# Patient Record
Sex: Female | Born: 1965 | Hispanic: No
Health system: Southern US, Community
[De-identification: ages and names within clinical notes are randomized; demographics above are authoritative.]

## PROBLEM LIST (undated history)

## (undated) DIAGNOSIS — K219 Gastro-esophageal reflux disease without esophagitis: Secondary | ICD-10-CM

---

## 2005-05-19 ENCOUNTER — Ambulatory Visit: Payer: Self-pay | Admitting: Family Medicine

## 2008-02-19 ENCOUNTER — Emergency Department: Payer: Self-pay | Admitting: Emergency Medicine

## 2008-06-29 ENCOUNTER — Emergency Department: Payer: Self-pay | Admitting: Emergency Medicine

## 2013-10-01 ENCOUNTER — Ambulatory Visit: Payer: Self-pay | Admitting: Family Medicine

## 2014-02-16 ENCOUNTER — Emergency Department: Payer: Self-pay | Admitting: Emergency Medicine

## 2018-05-24 ENCOUNTER — Emergency Department
Admission: EM | Admit: 2018-05-24 | Discharge: 2018-05-25 | Disposition: A | Discharge status: Home / Self Care | Payer: Self-pay | Attending: Emergency Medicine | Admitting: Emergency Medicine

## 2018-05-24 ENCOUNTER — Encounter: Payer: Self-pay | Admitting: Emergency Medicine

## 2018-05-24 DIAGNOSIS — J029 Acute pharyngitis, unspecified: Secondary | ICD-10-CM

## 2018-05-24 DIAGNOSIS — F17200 Nicotine dependence, unspecified, uncomplicated: Secondary | ICD-10-CM | POA: Insufficient documentation

## 2018-05-24 HISTORY — DX: Gastro-esophageal reflux disease without esophagitis: K21.9

## 2018-05-24 NOTE — ED Triage Notes (Signed)
Patient c/o allergic reaction with throat tightness. Patient reports facial swelling. Patient reports reaction began at 2130, patient took benadryl with no relief - still feels like 2 benadryl is stuck in her throat.

## 2018-05-25 ENCOUNTER — Emergency Department: Payer: Self-pay

## 2018-05-25 MED ORDER — LIDOCAINE VISCOUS HCL 2 % MT SOLN
15.0000 mL | Freq: Once | OROMUCOSAL | Status: AC
  Administered 2018-05-25: 15 mL via ORAL
  Filled 2018-05-25: qty 15

## 2018-05-25 MED ORDER — DEXAMETHASONE 10 MG/ML FOR PEDIATRIC ORAL USE
10.0000 mg | Freq: Once | INTRAMUSCULAR | Status: AC
  Administered 2018-05-25: 10 mg via ORAL
  Filled 2018-05-25: qty 1

## 2018-05-25 MED ORDER — ALUM & MAG HYDROXIDE-SIMETH 200-200-20 MG/5ML PO SUSP
30.0000 mL | Freq: Once | ORAL | Status: AC
  Administered 2018-05-25: 30 mL via ORAL
  Filled 2018-05-25: qty 30

## 2018-05-25 NOTE — Discharge Instructions (Addendum)
Your work-up was reassuring today.  Rather than an allergic reaction, I think it may be possible that you have a viral infection that is causing your sore throat and the sensation that you have something stuck in it.  Please continue to eat and drink plenty of fluids and take over-the-counter ibuprofen and/or Tylenol as needed for discomfort.  Follow-up with your regular doctor at the next available opportunity and return to the emergency department if you develop new or worsening symptoms that concern you.

## 2018-05-25 NOTE — ED Notes (Signed)
Reports throat feeling better, no longer as hoarse when inially arrived. Vss. Awaiting disposition.

## 2018-05-25 NOTE — ED Provider Notes (Signed)
[facility] Emergency Department Provider Note  ____________________________________________   First MD Initiated Contact with Patient 05/25/18 0015     (approximate)  I have reviewed the triage vital signs and the nursing notes.   HISTORY  Chief Complaint Allergic Reaction    HPI Ebony Cohen is a 53 y.o. female who presents for evaluation of possible allergic reaction and some discomfort in her throat.  She reports that she started feeling a scratchy throat and some possible swelling in her throat earlier tonight.  She had a severe reaction a couple of years ago to a medication that she no longer takes (she says it was not lisinopril but she does not remember the name of it) that resulted in swelling in her throat, tongue, and lips, so she was concerned it may be something similar.  She took a Benadryl but was not feeling better after couple of hours or took another 1 which feels like it may be stuck in her throat.  She is breathing comfortably, has not had a cough, is speaking clearly although with a slightly hoarse voice, and is not having any trouble swallowing her own secretions.  In fact she says she has been trying to drink plenty of fluids but continues to have the sensation of something stuck in her throat.  She describes the symptoms as mild to moderate but were concerning to her.  Nothing in particular makes it better or worse.  She denies any recent fever/chills, chest pain or short nests of breath, but she does state that she just recently was getting over a viral respiratory infection and in fact has gone through 2 different rounds of outpatient antibiotics including Augmentin and azithromycin.  She feels better in general but now has the throat symptoms.  Past Medical History:  Diagnosis Date  . GERD (gastroesophageal reflux disease)     There are no active problems to display for this patient.   History reviewed. No pertinent surgical  history.  Prior to Admission medications   Not on File    Allergies Blue dyes (parenteral)  No family history on file.  Social History Social History   Tobacco Use  . Smoking status: Current Every Day Smoker  . Smokeless tobacco: Never Used  Substance Use Topics  . Alcohol use: Not Currently  . Drug use: Not on file    Review of Systems Constitutional: No fever/chills Eyes: No visual changes. ENT: Sore throat with possible swelling and globus sensation. Cardiovascular: Denies chest pain. Respiratory: Denies shortness of breath.  Recently treated for viral versus bacterial respiratory infection. Gastrointestinal: No abdominal pain.  No nausea, no vomiting.  No diarrhea.  No constipation. Genitourinary: Negative for dysuria. Musculoskeletal: Negative for neck pain.  Negative for back pain. Integumentary: Negative for rash. Neurological: Negative for headaches, focal weakness or numbness.   ____________________________________________   PHYSICAL EXAM:  VITAL SIGNS: ED Triage Vitals  Enc Vitals Group     BP 05/24/18 2356 123/82     Pulse Rate 05/24/18 2356 (!) 104     Resp 05/24/18 2356 (!) 22     Temp 05/24/18 2356 97.6 F (36.4 C)     Temp Source 05/24/18 2356 Oral     SpO2 05/24/18 2356 98 %     Weight 05/24/18 2357 115.2 kg (254 lb)     Height 05/24/18 2357 1.588 m (5' 2.5")     Head Circumference --      Peak Flow --  Pain Score 05/24/18 2356 6     Pain Loc --      Pain Edu? --      Excl. in GC? --     Constitutional: Alert and oriented. Well appearing and in no acute distress. Eyes: Conjunctivae are normal.  Head: Atraumatic. Nose: No congestion/rhinnorhea. Mouth/Throat: Mucous membranes are moist.  Oropharynx non-erythematous.  No swelling of the tongue or lips, no angioedema in general. Neck: No stridor.  No meningeal signs.  No swelling. Cardiovascular: Normal rate, regular rhythm. Good peripheral circulation. Grossly normal heart  sounds. Respiratory: Normal respiratory effort.  No retractions. Lungs CTAB. Gastrointestinal: Soft and nontender. No distention.  Musculoskeletal: No lower extremity tenderness nor edema. No gross deformities of extremities. Neurologic:  Normal speech and language. No gross focal neurologic deficits are appreciated.  Skin:  Skin is warm, dry and intact. No rash noted. Psychiatric: Mood and affect are normal. Speech and behavior are normal.  ____________________________________________   LABS (all labs ordered are listed, but only abnormal results are displayed)  Labs Reviewed - No data to display ____________________________________________  EKG  No indication for EKG ____________________________________________  RADIOLOGY I, Loleta Rose, personally viewed and evaluated these images (plain radiographs) as part of my medical decision making, as well as reviewing the written report by the radiologist.  ED MD interpretation: No soft tissue abnormality identified on soft tissue neck radiographs  Official radiology report(s): Dg Neck Soft Tissue  Result Date: 05/25/2018 CLINICAL DATA:  Sore throat and globus sensation EXAM: NECK SOFT TISSUES - 1+ VIEW COMPARISON:  None. FINDINGS: There is no evidence of retropharyngeal soft tissue swelling or epiglottic enlargement. The cervical airway is unremarkable and no radio-opaque foreign body identified. IMPRESSION: No soft tissue abnormality of the neck. Electronically Signed   By: Deatra Robinson M.D.   On: 05/25/2018 00:47    ____________________________________________   PROCEDURES  Critical Care performed: No   Procedure(s) performed:   Procedures   ____________________________________________   INITIAL IMPRESSION / ASSESSMENT AND PLAN / ED COURSE  As part of my medical decision making, I reviewed the following data within the electronic MEDICAL RECORD NUMBER Nursing notes reviewed and incorporated, Old chart reviewed, Radiograph  reviewed , Notes from prior ED visits     Differential diagnosis includes, but is not limited to, viral illness, angioedema, globus sensation, retained esophageal foreign body, allergic reaction/anaphylaxis.  The patient has no rash, no itching, no difficulty breathing, and no obvious angioedema.  I do not think that her symptoms tonight represent an allergic reaction/anaphylaxis.  She has been dealing with a respiratory infection for an extended period of time and I believe that she has a mild viral pharyngitis.  She continues to have a globus sensation which could have been the result of having a Benadryl get stuck but she is having no difficulty taking oral fluids or handling her own secretions.  Radiographs were reassuring with no soft tissue swelling or suggestion of epiglottitis or retropharyngeal infection.  She has been stable for hours in the emergency department and I think it is appropriate for discharge and outpatient follow-up.  I gave my usual customary return precautions and she understands and agrees with the plan.     ____________________________________________  FINAL CLINICAL IMPRESSION(S) / ED DIAGNOSES  Final diagnoses:  Sore throat     MEDICATIONS GIVEN DURING THIS VISIT:  Medications  alum & mag hydroxide-simeth (MAALOX/MYLANTA) 200-200-20 MG/5ML suspension 30 mL (30 mLs Oral Given 05/25/18 0040)    And  lidocaine (XYLOCAINE)  2 % viscous mouth solution 15 mL (15 mLs Oral Given 05/25/18 0040)  dexamethasone (DECADRON) 10 MG/ML injection for Pediatric ORAL use 10 mg (10 mg Oral Given 05/25/18 0329)     ED Discharge Orders    None       Note:  This document was prepared using Dragon voice recognition software and may include unintentional dictation errors.    Loleta RoseForbach, Trevan Messman, MD 05/25/18 [phone]

## 2018-05-25 NOTE — ED Notes (Signed)
Pt verbalized understanding of d/c instructions and f/u care. No further questions at this time. Pt ambulatory with steady gait to the exit.  

## 2018-05-25 NOTE — ED Notes (Signed)
Returned from [employer]. GI cocktail given. Disposition pending.

## 2018-05-25 NOTE — ED Notes (Signed)
Reports took her meds and had a bowl of ceral prior to going to work and began feeling throat tightness. Patient states feeling hoarse, took 50mg  of benadryl po prior to ed arrival. Awaiting md eval and  Plan of care. Patient speaking full sentences with clear lungs sating 99% on ra.

## 2019-11-24 IMAGING — CR DG NECK SOFT TISSUE
2 series · 2 of 2 positions shown · non-contrast
Comparison: None.

CLINICAL DATA: Sore throat and globus sensation

EXAM:
NECK SOFT TISSUES - 1+ VIEW

[neck lat]
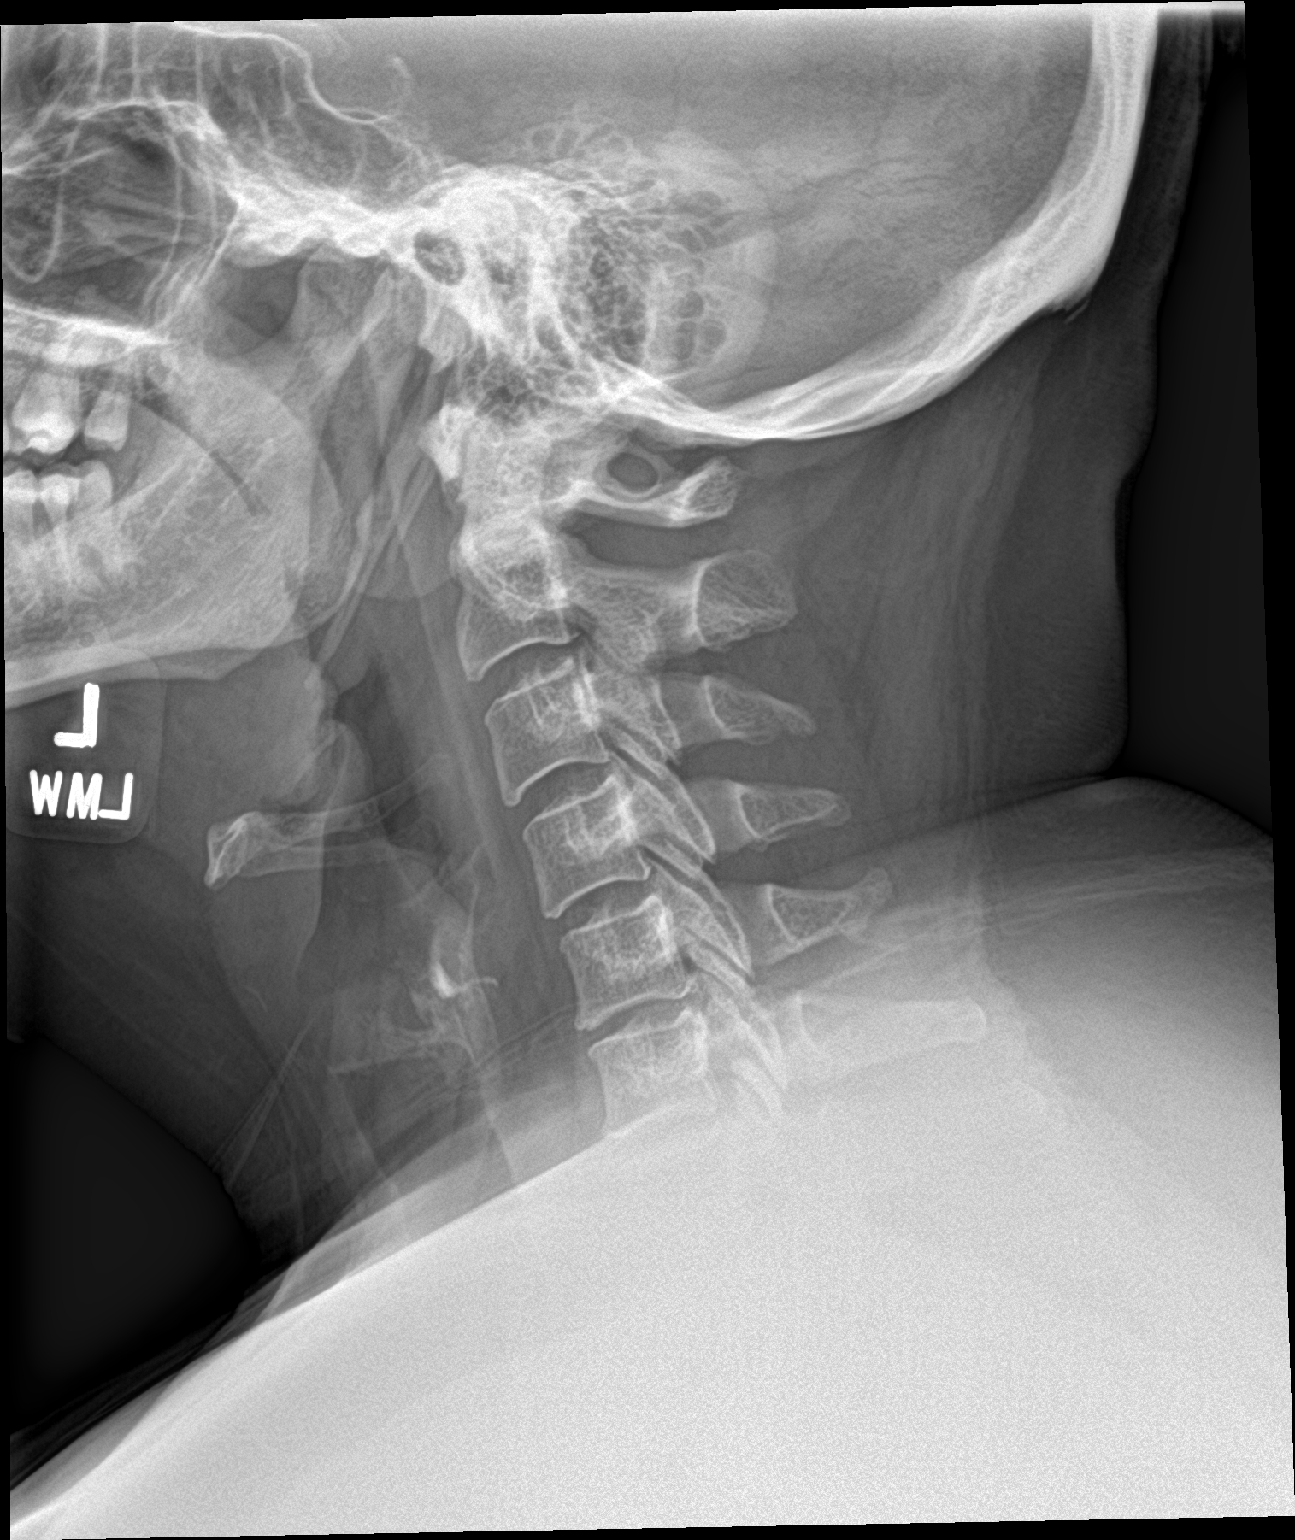

[neck ap]
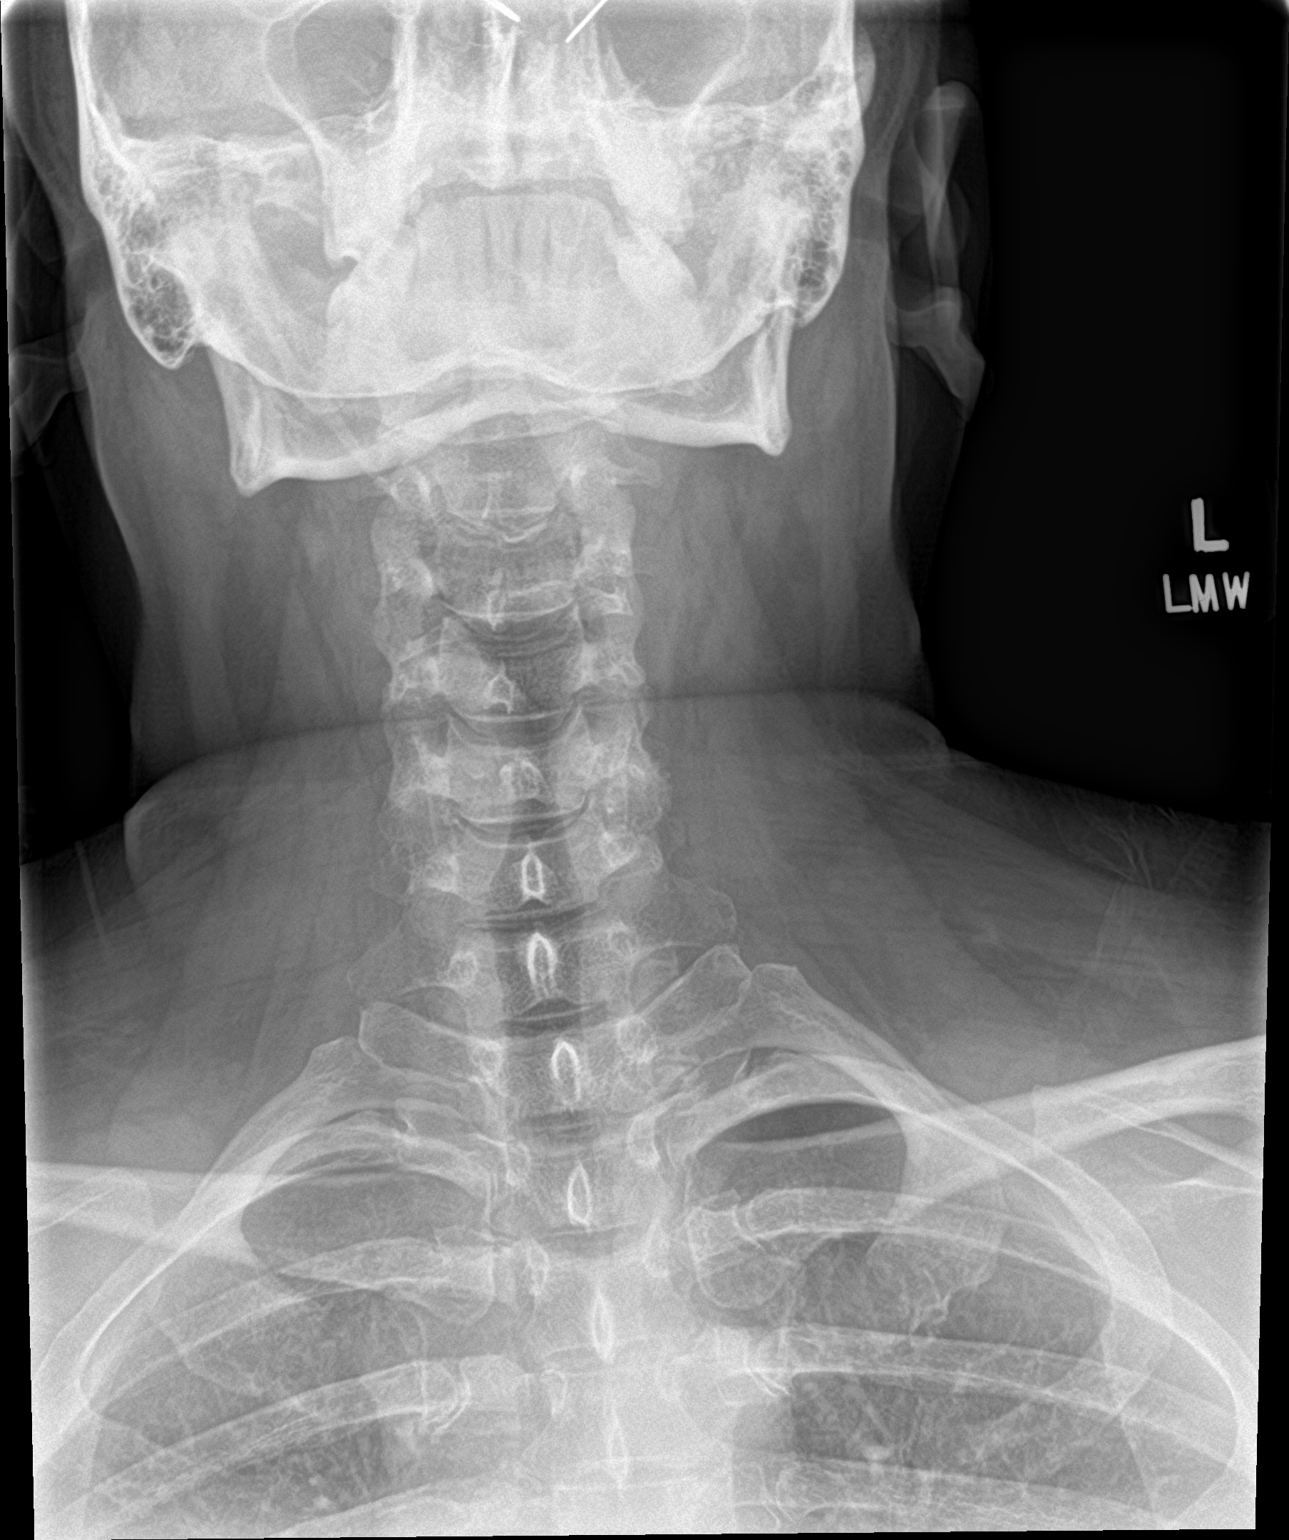

[2 of 2 positions shown; findings below may reference images not displayed]

FINDINGS: There is no evidence of retropharyngeal soft tissue swelling or
epiglottic enlargement. The cervical airway is unremarkable and no
radio-opaque foreign body identified.
IMPRESSION: No soft tissue abnormality of the neck.

## 2020-11-11 ENCOUNTER — Other Ambulatory Visit: Payer: Self-pay

## 2020-11-11 ENCOUNTER — Emergency Department
Admission: EM | Admit: 2020-11-11 | Discharge: 2020-11-11 | Disposition: A | Discharge status: Home / Self Care | Payer: BLUE CROSS/BLUE SHIELD | Attending: Emergency Medicine | Admitting: Emergency Medicine

## 2020-11-11 ENCOUNTER — Encounter: Payer: Self-pay | Admitting: Emergency Medicine

## 2020-11-11 DIAGNOSIS — F17210 Nicotine dependence, cigarettes, uncomplicated: Secondary | ICD-10-CM | POA: Diagnosis not present

## 2020-11-11 DIAGNOSIS — M25522 Pain in left elbow: Secondary | ICD-10-CM | POA: Diagnosis not present

## 2020-11-11 DIAGNOSIS — M25512 Pain in left shoulder: Secondary | ICD-10-CM | POA: Diagnosis not present

## 2020-11-11 DIAGNOSIS — M542 Cervicalgia: Secondary | ICD-10-CM | POA: Diagnosis present

## 2020-11-11 DIAGNOSIS — M5412 Radiculopathy, cervical region: Secondary | ICD-10-CM | POA: Diagnosis not present

## 2020-11-11 MED ORDER — IBUPROFEN 600 MG PO TABS
600.0000 mg | ORAL_TABLET | ORAL | Status: AC
  Administered 2020-11-11: 600 mg via ORAL
  Filled 2020-11-11: qty 1

## 2020-11-11 MED ORDER — OXYCODONE-ACETAMINOPHEN 5-325 MG PO TABS: 1.0000 | ORAL_TABLET | Freq: Four times a day (QID) | ORAL | 0 refills | Status: AC | PRN

## 2020-11-11 MED ORDER — KETOROLAC TROMETHAMINE 60 MG/2ML IM SOLN: 60.0000 mg | Freq: Once | INTRAMUSCULAR | Status: DC

## 2020-11-11 MED ORDER — METHYLPREDNISOLONE 4 MG PO TBPK: ORAL_TABLET | ORAL | 0 refills | Status: AC

## 2020-11-11 NOTE — ED Triage Notes (Signed)
Pt comes into the ED via POV c/o left shoulder pain and neck pain.  PT states she was seen by UC earlier in the week and given muscle relaxer's.  Pt states they only help for a short amount of time.  PT tearful in triage at this time and in NAD>

## 2020-11-11 NOTE — Discharge Instructions (Addendum)
Stop hydrocodone. Change to oxycodone (as prescribed today). Follow-up with Dr. Marcell Barlow or his partner in spine/neurosurgery clinic.

## 2020-11-11 NOTE — ED Notes (Addendum)
Pt denies any known injury, states that last Thursday am she woke up with excruciating pain in her left shoulder radiating down into her left arm, pt states that she went to the ortho urgent care and was diagnosed with a pinched nerve and placed on skelaxin, hydrocodone, and prednisone. Pt is crying stating that she woke up from a nap and her pain now is much worse

## 2020-11-11 NOTE — ED Provider Notes (Addendum)
[facility] Emergency Department Provider Note   ____________________________________________   Event Date/Time   First MD Initiated Contact with Patient 11/11/20 2037     (approximate)  I have reviewed the triage vital signs and the nursing notes.   HISTORY  Chief Complaint Neck Pain and Shoulder Pain    HPI Ebony Cohen is a 55 y.o. female's been experiencing left shoulder pain and left neck pain since about the end of June.  She reports the pain started and feels like a sharp discomfort at the base of her left neck radiating towards her left shoulder and sometimes shoots down towards the back of the left elbow.  It sharp and lancinating nature.  It is made worse by turning the head to the left as well as looking up and down  No chest pain.  No trouble breathing.  No fevers.  No nausea or vomiting  No numbness tingling or weakness except at times when she was showering when she is moving her arm around a lot she felt like it just feels a little bit fatigued in the left shoulder area  Denies any injury.  No swelling or redness of the joints.  She is taking 5 days of prednisone as well as hydrocodone for which she only has about 2 tablets left, in addition to muscle relaxant in the evening.  Pain persist despite this.  She will get some relief from the hydrocodone but will only last perhaps an hour or 2.  Past Medical History:  Diagnosis Date   GERD (gastroesophageal reflux disease)     There are no problems to display for this patient.   History reviewed. No pertinent surgical history.  Prior to Admission medications   Medication Sig Start Date End Date Taking? Authorizing Provider  methylPREDNISolone (MEDROL DOSEPAK) 4 MG TBPK tablet 1 medrol dose pack 11/11/20  Yes Sharyn Creamer, MD  oxyCODONE-acetaminophen (PERCOCET/ROXICET) 5-325 MG tablet Take 1-2 tablets by mouth every 6 (six) hours as needed for severe pain. 11/11/20  Yes Sharyn Creamer, MD     Allergies Blue dyes (parenteral) and Naproxen  History reviewed. No pertinent family history.  Social History Social History   Tobacco Use   Smoking status: Every Day    Pack years: 0.00   Smokeless tobacco: Never  Substance Use Topics   Alcohol use: Not Currently    Review of Systems Constitutional: No fever/chills Eyes: No visual changes. ENT: No sore throat. Cardiovascular: Denies chest pain. Respiratory: Denies shortness of breath. Gastrointestinal: No abdominal pain.   Musculoskeletal: Negative for back pain.  Reports pain in the left neck. Skin: Negative for rash. Neurological: Negative for headaches, areas of focal weakness or numbness.  No weakness or numbness in the left hand.    ____________________________________________   PHYSICAL EXAM:  VITAL SIGNS: ED Triage Vitals  Enc Vitals Group     BP 11/11/20 1839 (!) 177/109     Pulse Rate 11/11/20 1839 97     Resp 11/11/20 1839 20     Temp 11/11/20 1839 97.7 F (36.5 C)     Temp Source 11/11/20 1839 Oral     SpO2 11/11/20 1839 97 %     Weight 11/11/20 1840 243 lb (110.2 kg)     Height 11/11/20 1840 5' 2.5" (1.588 m)     Head Circumference --      Peak Flow --      Pain Score 11/11/20 1840 10     Pain Loc --  Pain Edu? --      Excl. in GC? --     Constitutional: Alert and oriented.  Appears in moderate pain.  Reports pain in the left neck rating to left shoulder area. Eyes: Conjunctivae are normal. Head: Atraumatic. Nose: No congestion/rhinnorhea. Mouth/Throat: Mucous membranes are moist. Neck: No stridor.  Patient's pain in the left neck left shoulder is worsened by turning head to the left as well as looking up and down.  Pain is not elicited by turning the head to the right.  With axial loading the patient does also report pain in the left lower neck that radiates towards her left shoulder.  Pain appears quite reproducible and she also has some tenderness along the left paraspinous muscles  at the base of the neck approximately around C6-C7 region as well as some midpoint cervical tenderness today. Cardiovascular: Normal rate, regular rhythm. Grossly normal heart sounds.  Good peripheral circulation. Respiratory: Normal respiratory effort.  No retractions. Lungs CTAB. Musculoskeletal: No lower extremity tenderness nor edema.  5 out of 5 strength in all extremities.  LEFT Left upper extremity demonstrates normal strength, good use of all muscles. No edema bruising or contusions of the left shoulder/upper arm, left elbow, left forearm / hand. Full range of motion of the left  upper extremity but abduction seems to worsen the pain in the left shoulder left neck area, and reaching behind her she reports actually provide some relief of discomfort. No evidence of trauma. Strong radial pulse. Intact median/ulnar/radial neuro-muscular exam.   Neurologic:  Normal speech and language. No gross focal neurologic deficits are appreciated.  Skin:  Skin is warm, dry and intact. No rash noted. Psychiatric: Mood and affect are normal. Speech and behavior are normal.  ____________________________________________   LABS (all labs ordered are listed, but only abnormal results are displayed)  Labs Reviewed - No data to display ____________________________________________  EKG   ____________________________________________  RADIOLOGY  No indication for acute imaging.  No acute neurodeficits.  No history of trauma.  Reassuring clinical examination with reproducible discomfort that seems to suggest radicular discomfort. ____________________________________________   PROCEDURES  Procedure(s) performed: None  Procedures  Critical Care performed: No  ____________________________________________   INITIAL IMPRESSION / ASSESSMENT AND PLAN / ED COURSE  Pertinent labs & imaging results that were available during my care of the patient were reviewed by me and considered in my medical  decision making (see chart for details).   No acute cardiopulmonary or neurologic symptoms.  Pain in the left neck reproducible with sharp lancinating pain running from the base the neck towards the left shoulder to the back of the left elbow at times.  Seems to be consistent with pain in any dermatomal distribution along the C6 or C7 nerve roots.  There is no associated muscular weakness.  No neurologic deficits.  Discussed with the patient, and given her pain is still poorly controlled, will prescribe additional steroid this with taper as well as trial oxycodone for which she is agreeable to not use hydrocodone, and only using oxycodone as prescribed with follow-up anticipated with neurosurgery services.  She is agreeable to not driving while taking oxycodone and she will not use this with her muscle relaxant or hydrocodone.  Clinical Course as of 11/11/20 2153  Wed Nov 11, 2020  2104 Blood pressure is noted to be elevated, but the patient also also having painful discomfort radiating from the left neck down towards her left shoulder.  We had considered giving her Toradol, but she  advises that she had throat swelling once after taking naproxen, thus contraindicating use of Toradol but she does report she can tolerate ibuprofen without incident.  Patient prescribed oxycodone for additional pain relief given her ongoing painful condition as well as additional steroid.  Follow-up recommended with neurosurgery which patient is agreeable with.  Instructed patient she may not drive while taking oxycodone, do not and cannot utilize with hydrocodone or other muscle relaxant she is understanding agreeable with this plan. [MQ]     2105 I will prescribe the patient a narcotic pain medicine due to their condition which I anticipate will cause at least moderate pain short term. I discussed with the patient safe use of narcotic pain medicines, and that they are not to drive, work in dangerous areas, or ever take more  than prescribed (no more than 1 pill every 6 hours). We discussed that this is the type of medication that can be  overdosed on and the risks of this type of medicine. Patient is very agreeable to only use as prescribed and to never use more than prescribed.  [MQ]    Clinical Course User Index [MQ] Sharyn Creamer, MD   Return precautions and treatment recommendations and follow-up discussed with the patient who is agreeable with the plan.   ____________________________________________   FINAL CLINICAL IMPRESSION(S) / ED DIAGNOSES  Final diagnoses:  Cervical radiculopathy, acute        Note:  This document was prepared using Dragon voice recognition software and may include unintentional dictation errors       Sharyn Creamer, MD 11/11/20 2159    Sharyn Creamer, MD 11/11/20 2221

## 2021-01-01 ENCOUNTER — Other Ambulatory Visit: Payer: Self-pay

## 2021-01-01 ENCOUNTER — Other Ambulatory Visit: Payer: Self-pay | Admitting: Physical Medicine & Rehabilitation

## 2021-01-01 DIAGNOSIS — G8929 Other chronic pain: Secondary | ICD-10-CM
